# Patient Record
Sex: Male | Born: 1980 | Race: White | Hispanic: No | Marital: Single | Smoking: Never smoker
Health system: Southern US, Community
[De-identification: ages and names within clinical notes are randomized; demographics above are authoritative.]

## PROBLEM LIST (undated history)

## (undated) DIAGNOSIS — G7111 Myotonic muscular dystrophy: Secondary | ICD-10-CM

## (undated) DIAGNOSIS — J9621 Acute and chronic respiratory failure with hypoxia: Secondary | ICD-10-CM

## (undated) DIAGNOSIS — I469 Cardiac arrest, cause unspecified: Secondary | ICD-10-CM

## (undated) DIAGNOSIS — R001 Bradycardia, unspecified: Secondary | ICD-10-CM

## (undated) HISTORY — PX: TRACHEOSTOMY: SUR1362

---

## 2017-11-10 ENCOUNTER — Inpatient Hospital Stay
Admission: RE | Admit: 2017-11-10 | Discharge: 2017-12-07 | Disposition: A | Discharge status: Home Health | Payer: Medicaid Other | Source: Ambulatory Visit | Attending: Internal Medicine | Admitting: Internal Medicine

## 2017-11-10 ENCOUNTER — Other Ambulatory Visit (HOSPITAL_COMMUNITY): Payer: Medicaid Other

## 2017-11-10 DIAGNOSIS — J9621 Acute and chronic respiratory failure with hypoxia: Secondary | ICD-10-CM

## 2017-11-10 DIAGNOSIS — I469 Cardiac arrest, cause unspecified: Secondary | ICD-10-CM | POA: Diagnosis present

## 2017-11-10 DIAGNOSIS — Z431 Encounter for attention to gastrostomy: Secondary | ICD-10-CM

## 2017-11-10 DIAGNOSIS — G7111 Myotonic muscular dystrophy: Secondary | ICD-10-CM | POA: Diagnosis present

## 2017-11-10 DIAGNOSIS — R001 Bradycardia, unspecified: Secondary | ICD-10-CM | POA: Diagnosis present

## 2017-11-10 DIAGNOSIS — J189 Pneumonia, unspecified organism: Secondary | ICD-10-CM

## 2017-11-10 HISTORY — DX: Bradycardia, unspecified: R00.1

## 2017-11-10 HISTORY — DX: Myotonic muscular dystrophy: G71.11

## 2017-11-10 HISTORY — DX: Cardiac arrest, cause unspecified: I46.9

## 2017-11-10 HISTORY — DX: Acute and chronic respiratory failure with hypoxia: J96.21

## 2017-11-10 LAB — BLOOD GAS, ARTERIAL
Acid-Base Excess: 2.8 mmol/L — ABNORMAL HIGH (ref 0.0–2.0)
BICARBONATE: 28.1 mmol/L — AB (ref 20.0–28.0)
FIO2: 50
LHR: 12 {breaths}/min
MECHVT: 500 mL
O2 SAT: 99.1 %
PATIENT TEMPERATURE: 98.6
PCO2 ART: 53.2 mmHg — AB (ref 32.0–48.0)
PEEP/CPAP: 5 cmH2O
PH ART: 7.342 — AB (ref 7.350–7.450)
pO2, Arterial: 133 mmHg — ABNORMAL HIGH (ref 83.0–108.0)

## 2017-11-10 MED ORDER — IOPAMIDOL (ISOVUE-300) INJECTION 61%
INTRAVENOUS | Status: AC
  Filled 2017-11-10: qty 50

## 2017-11-11 LAB — CBC WITH DIFFERENTIAL/PLATELET
ABS IMMATURE GRANULOCYTES: 0.1 10*3/uL (ref 0.0–0.1)
BASOS PCT: 1 %
Basophils Absolute: 0.1 10*3/uL (ref 0.0–0.1)
Eosinophils Absolute: 0.3 10*3/uL (ref 0.0–0.7)
Eosinophils Relative: 3 %
HCT: 27.8 % — ABNORMAL LOW (ref 39.0–52.0)
HEMOGLOBIN: 8.2 g/dL — AB (ref 13.0–17.0)
Immature Granulocytes: 1 %
LYMPHS PCT: 11 %
Lymphs Abs: 1.1 10*3/uL (ref 0.7–4.0)
MCH: 28.5 pg (ref 26.0–34.0)
MCHC: 29.5 g/dL — ABNORMAL LOW (ref 30.0–36.0)
MCV: 96.5 fL (ref 78.0–100.0)
MONO ABS: 0.6 10*3/uL (ref 0.1–1.0)
Monocytes Relative: 6 %
Neutro Abs: 7.6 10*3/uL (ref 1.7–7.7)
Neutrophils Relative %: 78 %
PLATELETS: 161 10*3/uL (ref 150–400)
RBC: 2.88 MIL/uL — ABNORMAL LOW (ref 4.22–5.81)
RDW: 14.4 % (ref 11.5–15.5)
WBC: 9.7 10*3/uL (ref 4.0–10.5)

## 2017-11-11 LAB — COMPREHENSIVE METABOLIC PANEL
ALBUMIN: 2.5 g/dL — AB (ref 3.5–5.0)
ALK PHOS: 80 U/L (ref 38–126)
ALT: 23 U/L (ref 0–44)
AST: 27 U/L (ref 15–41)
Anion gap: 5 (ref 5–15)
BILIRUBIN TOTAL: 0.7 mg/dL (ref 0.3–1.2)
BUN: 8 mg/dL (ref 6–20)
CO2: 29 mmol/L (ref 22–32)
CREATININE: 0.67 mg/dL (ref 0.61–1.24)
Calcium: 9.1 mg/dL (ref 8.9–10.3)
Chloride: 109 mmol/L (ref 98–111)
GFR calc Af Amer: >60 mL/min (ref >60)
GFR calc non Af Amer: >60 mL/min (ref >60)
GLUCOSE: 117 mg/dL — AB (ref 70–99)
POTASSIUM: 4.3 mmol/L (ref 3.5–5.1)
Sodium: 143 mmol/L (ref 135–145)
TOTAL PROTEIN: 5.9 g/dL — AB (ref 6.5–8.1)

## 2017-11-12 DIAGNOSIS — I469 Cardiac arrest, cause unspecified: Secondary | ICD-10-CM | POA: Diagnosis not present

## 2017-11-12 DIAGNOSIS — J150 Pneumonia due to Klebsiella pneumoniae: Secondary | ICD-10-CM | POA: Diagnosis not present

## 2017-11-12 DIAGNOSIS — G7111 Myotonic muscular dystrophy: Secondary | ICD-10-CM | POA: Diagnosis not present

## 2017-11-12 DIAGNOSIS — J9621 Acute and chronic respiratory failure with hypoxia: Secondary | ICD-10-CM | POA: Diagnosis not present

## 2017-11-12 NOTE — Consult Note (Signed)
Pulmonary Critical Care Medicine [facility] [place]  PULMONARY SERVICE  Date of Service: 11/12/2017  PULMONARY CRITICAL CARE CONSULT   Seth Melton  ZOX:[id number]  DOB: 09/30/1980   DOA: 11/10/2017  Referring Physician: Carron Curie, MD  HPI: Seth Melton is a 37 y.o. male seen for follow up of Acute on Chronic Respiratory Failure.  Patient is transferred to our facility for further management and weaning.  Basically has a past medical history significant for myotonic dystrophy renal calculi.  Patient is on home oxygen for chronic respiratory failure.  Patient was admitted at the transferring facility for a right lower lobe pneumonia along with the pleural effusion.  At that time patient was emergently intubated because of respiratory failure.  He developed hypotension which required pressors.  Concern was raised that patient might have aspiration pneumonia was treated as such.  Transferred to a tertiary care center patient apparently grew Klebsiella.  He had been started on vancomycin and Zosyn and then changed over to Augmentin to complete his course.  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  Also underwent an ejection fraction which showed a normal systolic function.  Patient was eventually transferred to our facility for further management and weaning.  Review of Systems:  ROS performed and is unremarkable other than noted above.  Past Medical History:  Diagnosis Date  . Acute on chronic respiratory failure with hypoxia (HCC) 11/13/2017  . Bradycardia   . Cardiac arrest (HCC)   . Myotonic dystrophy [facility])     Past Surgical History:  Procedure Laterality Date  . TRACHEOSTOMY      Social History:    reports that he has never smoked. He has never used smokeless tobacco. He reports that he drank alcohol. He reports that he has current or past drug history.  Family History: Non-Contributory to the present  illness   Medications: Reviewed on Rounds  Physical Exam:  Vitals: Temperature is 98.9 pulse 80 respiratory rate 14 blood pressure 106/60 saturations 99%  Ventilator Settings mode of ventilation pressure support FiO2 30% tidal volume 450 pressure support 12 PEEP 5  . General: Comfortable at this time . Eyes: Grossly normal lids, irises & conjunctiva . ENT: grossly tongue is normal . Neck: no obvious mass . Cardiovascular: S1-S2 normal no gallop or rub . Respiratory: Coarse breath sounds no rhonchi rales . Abdomen: Soft and nontender . Skin: no rash seen on limited exam . Musculoskeletal: not rigid . Psychiatric:unable to assess . Neurologic: no seizure no involuntary movements         Labs on Admission:  Basic Metabolic Panel: Recent Labs  Lab 11/11/17 0939  NA 143  K 4.3  CL 109  CO2 29  GLUCOSE 117*  BUN 8  CREATININE 0.67  CALCIUM 9.1    Recent Labs  Lab 11/10/17 1830  PHART 7.342*  PCO2ART 53.2*  PO2ART 133*  HCO3 28.1*  O2SAT 99.1    Liver Function Tests: Recent Labs  Lab 11/11/17 0939  AST 27  ALT 23  ALKPHOS 80  BILITOT 0.7  PROT 5.9*  ALBUMIN 2.5*   No results for input(s): LIPASE, AMYLASE in the last 168 hours. No results for input(s): AMMONIA in the last 168 hours.  CBC: Recent Labs  Lab 11/11/17 0939  WBC 9.7  NEUTROABS 7.6  HGB 8.2*  HCT 27.8*  MCV 96.5  PLT 161    Cardiac Enzymes: No results for input(s): CKTOTAL, CKMB, CKMBINDEX, TROPONINI in the last  168 hours.  BNP (last 3 results) No results for input(s): BNP in the last 8760 hours.  ProBNP (last 3 results) No results for input(s): PROBNP in the last 8760 hours.   Radiological Exams on Admission: Dg Chest Port 1 View  Result Date: 11/10/2017 CLINICAL DATA:  37 year old male with possible pneumonia EXAM: PORTABLE CHEST 1 VIEW COMPARISON:  Concurrently obtained radiographs of the abdomen FINDINGS: Tracheostomy tube in place, midline and at the level of the  clavicles. Left subclavian approach cardiac rhythm maintenance device with leads projecting over the right atrium and right ventricle. Inspiratory volumes are very low. Nonspecific medial right basilar opacity favored to reflect atelectasis. IMPRESSION: Very low inspiratory volumes. Nonspecific right basilar opacity favored to reflect chronic atelectasis/scarring. Superimposed infiltrate would be difficult to exclude entirely. Well-positioned tracheostomy tube and left subclavian approach cardiac rhythm maintenance device. Electronically Signed   By: Malachy Moan M.D.   On: 11/10/2017 18:16   Dg Abd Portable 1v  Result Date: 11/10/2017 CLINICAL DATA:  Evaluate percutaneous gastrostomy tube position EXAM: PORTABLE ABDOMEN - 1 VIEW COMPARISON:  Concurrently obtained chest x-ray FINDINGS: Contrast injection through the existing tube opacifies the stomach and proximal small bowel. The bowel gas pattern is not obstructed. IMPRESSION: The percutaneous gastrostomy tube appears to be in the region of the gastric antrum. Electronically Signed   By: Malachy Moan M.D.   On: 11/10/2017 18:17    Assessment/Plan Active Problems:   Acute on chronic respiratory failure with hypoxia (HCC)   Myotonic dystrophy (HCC)   Cardiac arrest (HCC)   Bradycardia   1. Acute on chronic respiratory failure with hypoxia patient's weaning on pressure support tolerating it well.  The goal is for 16-hour wean continue to advance the wean as tolerated continue pulmonary toilet secretion management.  Titrate oxygen as tolerated. 2. Lobar pneumonia patient was treated with antibiotics we will continue to monitor with follow-up chest x-rays. 3. Pulmonary embolism treated he had been on Eliquis 4. Myotonic dystrophy we will continue with supportive care physical therapy to assess 5. Cardiac arrest rhythm has been stable 6. Bradycardia we will monitor the rhythm as noted  I have personally seen and evaluated the patient,  evaluated laboratory and imaging results, formulated the assessment and plan and placed orders. The Patient requires high complexity decision making for assessment and support.  Case was discussed on Rounds with the Respiratory Therapy Staff Time Spent review of the medical record and discussion with the patient's mother and father at the bedside along with discussion with medical staff  Yevonne Pax, MD [facility] Pulmonary Critical Care Medicine Sleep Medicine

## 2017-11-13 ENCOUNTER — Encounter: Payer: Self-pay | Admitting: Internal Medicine

## 2017-11-13 DIAGNOSIS — J9621 Acute and chronic respiratory failure with hypoxia: Secondary | ICD-10-CM | POA: Diagnosis not present

## 2017-11-13 DIAGNOSIS — R001 Bradycardia, unspecified: Secondary | ICD-10-CM | POA: Diagnosis not present

## 2017-11-13 DIAGNOSIS — I469 Cardiac arrest, cause unspecified: Secondary | ICD-10-CM | POA: Diagnosis not present

## 2017-11-13 DIAGNOSIS — G7111 Myotonic muscular dystrophy: Secondary | ICD-10-CM | POA: Diagnosis not present

## 2017-11-13 HISTORY — DX: Acute and chronic respiratory failure with hypoxia: J96.21

## 2017-11-13 NOTE — Progress Notes (Signed)
Pulmonary Critical Care Medicine [facility] [place]   PULMONARY CRITICAL CARE SERVICE  PROGRESS NOTE  Date of Service: 11/13/2017  Seth Melton  BUL:[id number]  DOB: [birth year]   DOA: 11/10/2017  Referring Physician: Carron Curie, MD  HPI: Seth Melton is a 37 y.o. male seen for follow up of Acute on Chronic Respiratory Failure.  Patient is on T collar has been on 35% FiO2 so far is tolerating well  Medications: Reviewed on Rounds  Physical Exam:  Vitals: Temperature 98.2 pulse 83 respiratory rate 20 blood pressure 120/69 saturations 96%  Ventilator Settings off the ventilator on T collar trials  . General: Comfortable at this time . Eyes: Grossly normal lids, irises & conjunctiva . ENT: grossly tongue is normal . Neck: no obvious mass . Cardiovascular: S1 S2 normal no gallop . Respiratory: No rhonchi no rales . Abdomen: soft . Skin: no rash seen on limited exam . Musculoskeletal: not rigid . Psychiatric:unable to assess . Neurologic: no seizure no involuntary movements         Lab Data:   Basic Metabolic Panel: Recent Labs  Lab 11/11/17 0939  NA 143  K 4.3  CL 109  CO2 29  GLUCOSE 117*  BUN 8  CREATININE 0.67  CALCIUM 9.1    ABG: Recent Labs  Lab 11/10/17 1830  PHART 7.342*  PCO2ART 53.2*  PO2ART 133*  HCO3 28.1*  O2SAT 99.1    Liver Function Tests: Recent Labs  Lab 11/11/17 0939  AST 27  ALT 23  ALKPHOS 80  BILITOT 0.7  PROT 5.9*  ALBUMIN 2.5*   No results for input(s): LIPASE, AMYLASE in the last 168 hours. No results for input(s): AMMONIA in the last 168 hours.  CBC: Recent Labs  Lab 11/11/17 0939  WBC 9.7  NEUTROABS 7.6  HGB 8.2*  HCT 27.8*  MCV 96.5  PLT 161    Cardiac Enzymes: No results for input(s): CKTOTAL, CKMB, CKMBINDEX, TROPONINI in the last 168 hours.  BNP (last 3 results) No results for input(s): BNP in the last 8760 hours.  ProBNP (last 3 results) No results for input(s): PROBNP in the last  8760 hours.  Radiological Exams: No results found.  Assessment/Plan Active Problems:   Acute on chronic respiratory failure with hypoxia (HCC)   Myotonic dystrophy (HCC)   Cardiac arrest (HCC)   Bradycardia   1. Acute on chronic respiratory failure with hypoxia continue to wean on T collar as ordered.  Continue with secretion management pulmonary toilet. 2. Myotonic dystrophy at baseline continue with supportive care. 3. Cardiac arrest rhythm has been stable 4. Bradycardia stable continue to monitor   I have personally seen and evaluated the patient, evaluated laboratory and imaging results, formulated the assessment and plan and placed orders. The Patient requires high complexity decision making for assessment and support.  Case was discussed on Rounds with the Respiratory Therapy Staff  Yevonne Pax, MD [facility] Pulmonary Critical Care Medicine Sleep Medicine

## 2017-11-14 DIAGNOSIS — R001 Bradycardia, unspecified: Secondary | ICD-10-CM | POA: Diagnosis not present

## 2017-11-14 DIAGNOSIS — I469 Cardiac arrest, cause unspecified: Secondary | ICD-10-CM | POA: Diagnosis not present

## 2017-11-14 DIAGNOSIS — J9621 Acute and chronic respiratory failure with hypoxia: Secondary | ICD-10-CM | POA: Diagnosis not present

## 2017-11-14 DIAGNOSIS — G7111 Myotonic muscular dystrophy: Secondary | ICD-10-CM | POA: Diagnosis not present

## 2017-11-14 NOTE — Progress Notes (Signed)
Pulmonary Critical Care Medicine [facility] [place]   PULMONARY CRITICAL CARE SERVICE  PROGRESS NOTE  Date of Service: 11/14/2017  KARAR WILKER  DJM:[id number]  DOB: Dec 31, 1980   DOA: 11/10/2017  Referring Physician: Carron Curie, MD  HPI: BATUHAN ROLLER is a 37 y.o. male seen for follow up of Acute on Chronic Respiratory Failure.  Currently is on T collar has been on 35% oxygen did about 11 hours yesterday today the goal is to do about 16 hours on the T collar  Medications: Reviewed on Rounds  Physical Exam:  Vitals: Temperature 98.2 pulse 80 respiratory rate 12 blood pressure 90/56 saturation 96%  Ventilator Settings off the ventilator on T collar FiO2 35%  . General: Comfortable at this time . Eyes: Grossly normal lids, irises & conjunctiva . ENT: grossly tongue is normal . Neck: no obvious mass . Cardiovascular: S1 S2 normal no gallop . Respiratory: Good air entry no rhonchi or rales . Abdomen: soft . Skin: no rash seen on limited exam . Musculoskeletal: not rigid . Psychiatric:unable to assess . Neurologic: no seizure no involuntary movements         Lab Data:   Basic Metabolic Panel: Recent Labs  Lab 11/11/17 0939  NA 143  K 4.3  CL 109  CO2 29  GLUCOSE 117*  BUN 8  CREATININE 0.67  CALCIUM 9.1    ABG: Recent Labs  Lab 11/10/17 1830  PHART 7.342*  PCO2ART 53.2*  PO2ART 133*  HCO3 28.1*  O2SAT 99.1    Liver Function Tests: Recent Labs  Lab 11/11/17 0939  AST 27  ALT 23  ALKPHOS 80  BILITOT 0.7  PROT 5.9*  ALBUMIN 2.5*   No results for input(s): LIPASE, AMYLASE in the last 168 hours. No results for input(s): AMMONIA in the last 168 hours.  CBC: Recent Labs  Lab 11/11/17 0939  WBC 9.7  NEUTROABS 7.6  HGB 8.2*  HCT 27.8*  MCV 96.5  PLT 161    Cardiac Enzymes: No results for input(s): CKTOTAL, CKMB, CKMBINDEX, TROPONINI in the last 168 hours.  BNP (last 3 results) No results for input(s): BNP in the last 8760  hours.  ProBNP (last 3 results) No results for input(s): PROBNP in the last 8760 hours.  Radiological Exams: No results found.  Assessment/Plan Active Problems:   Acute on chronic respiratory failure with hypoxia (HCC)   Myotonic dystrophy (HCC)   Cardiac arrest (HCC)   Bradycardia   1. Acute on chronic respiratory failure with hypoxia doing well on the wean right now we will continue to advance the goal is for about 16 hours. 2. Myotonic dystrophy at baseline 3. Cardiac arrest rhythm is stable 4. Bradycardia as noted rhythm is stable right now we will continue to monitor   I have personally seen and evaluated the patient, evaluated laboratory and imaging results, formulated the assessment and plan and placed orders. The Patient requires high complexity decision making for assessment and support.  Case was discussed on Rounds with the Respiratory Therapy Staff  Yevonne Pax, MD [facility] Pulmonary Critical Care Medicine Sleep Medicine

## 2017-11-15 DIAGNOSIS — G7111 Myotonic muscular dystrophy: Secondary | ICD-10-CM | POA: Diagnosis not present

## 2017-11-15 DIAGNOSIS — I469 Cardiac arrest, cause unspecified: Secondary | ICD-10-CM | POA: Diagnosis not present

## 2017-11-15 DIAGNOSIS — J9621 Acute and chronic respiratory failure with hypoxia: Secondary | ICD-10-CM | POA: Diagnosis not present

## 2017-11-15 DIAGNOSIS — R001 Bradycardia, unspecified: Secondary | ICD-10-CM | POA: Diagnosis not present

## 2017-11-15 NOTE — Progress Notes (Signed)
Pulmonary Critical Care Medicine [facility] [place]   PULMONARY CRITICAL CARE SERVICE  PROGRESS NOTE  Date of Service: 11/15/2017  Seth Melton  [id number]:[id number]  DOB: [birth year]   DOA: 11/10/2017  Referring Physician: Carron Curie, MD  HPI: Seth Melton is a 37 y.o. male seen for follow up of Acute on Chronic Respiratory Failure.  Patient is weaning doing fairly well has been on T collar the goal today is for 20 hours  Medications: Reviewed on Rounds  Physical Exam:  Vitals: Temperature 98.0 pulse 80 respiratory rate 12 blood pressure 102/62 saturation 95%  Ventilator Settings off the ventilator on T collar FiO2 35%  . General: Comfortable at this time . Eyes: Grossly normal lids, irises & conjunctiva . ENT: grossly tongue is normal . Neck: no obvious mass . Cardiovascular: S1 S2 normal no gallop . Respiratory: Coarse breath sounds few rhonchi . Abdomen: soft . Skin: no rash seen on limited exam . Musculoskeletal: not rigid . Psychiatric:unable to assess . Neurologic: no seizure no involuntary movements         Lab Data:   Basic Metabolic Panel: Recent Labs  Lab 11/11/17 0939  NA 143  K 4.3  CL 109  CO2 29  GLUCOSE 117*  BUN 8  CREATININE 0.67  CALCIUM 9.1    ABG: Recent Labs  Lab 11/10/17 1830  PHART 7.342*  PCO2ART 53.2*  PO2ART 133*  HCO3 28.1*  O2SAT 99.1    Liver Function Tests: Recent Labs  Lab 11/11/17 0939  AST 27  ALT 23  ALKPHOS 80  BILITOT 0.7  PROT 5.9*  ALBUMIN 2.5*   No results for input(s): LIPASE, AMYLASE in the last 168 hours. No results for input(s): AMMONIA in the last 168 hours.  CBC: Recent Labs  Lab 11/11/17 0939  WBC 9.7  NEUTROABS 7.6  HGB 8.2*  HCT 27.8*  MCV 96.5  PLT 161    Cardiac Enzymes: No results for input(s): CKTOTAL, CKMB, CKMBINDEX, TROPONINI in the last 168 hours.  BNP (last 3 results) No results for input(s): BNP in the last 8760 hours.  ProBNP (last 3 results) No results  for input(s): PROBNP in the last 8760 hours.  Radiological Exams: No results found.  Assessment/Plan Active Problems:   Acute on chronic respiratory failure with hypoxia (HCC)   Myotonic dystrophy (HCC)   Cardiac arrest (HCC)   Bradycardia   1. Acute on chronic respiratory failure with hypoxia we will continue with weaning on T collar continue aggressive pulmonary toilet supportive care.  The goal is 20 hours 2. Myotonic dystrophy at baseline continue to monitor 3. Cardiac arrest stable rhythm 4. Bradycardia rhythm has been stable   I have personally seen and evaluated the patient, evaluated laboratory and imaging results, formulated the assessment and plan and placed orders. The Patient requires high complexity decision making for assessment and support.  Case was discussed on Rounds with the Respiratory Therapy Staff  Yevonne Pax, MD [facility] Pulmonary Critical Care Medicine Sleep Medicine

## 2017-11-16 DIAGNOSIS — I469 Cardiac arrest, cause unspecified: Secondary | ICD-10-CM | POA: Diagnosis not present

## 2017-11-16 DIAGNOSIS — J9621 Acute and chronic respiratory failure with hypoxia: Secondary | ICD-10-CM | POA: Diagnosis not present

## 2017-11-16 DIAGNOSIS — G7111 Myotonic muscular dystrophy: Secondary | ICD-10-CM | POA: Diagnosis not present

## 2017-11-16 DIAGNOSIS — R001 Bradycardia, unspecified: Secondary | ICD-10-CM | POA: Diagnosis not present

## 2017-11-16 NOTE — Progress Notes (Signed)
Pulmonary Critical Care Medicine [facility] [place]   PULMONARY CRITICAL CARE SERVICE  PROGRESS NOTE  Date of Service: 11/16/2017  Seth Melton  CMK:[id number]  DOB: [birth year]   DOA: 11/10/2017  Referring Physician: Carron Curie, MD  HPI: Seth Melton is a 37 y.o. male seen for follow up of Acute on Chronic Respiratory Failure.  Patient is weaning doing well on the T collar the goal is for 24 hours.  Right now is on 35% oxygen good saturations are noted.  Medications: Reviewed on Rounds  Physical Exam:  Vitals: Temperature 99.2 pulse 84 respiratory rate 13 blood pressure 96/55 saturations 93%  Ventilator Settings off of the ventilator right now on T collar  . General: Comfortable at this time . Eyes: Grossly normal lids, irises & conjunctiva . ENT: grossly tongue is normal . Neck: no obvious mass . Cardiovascular: S1 S2 normal no gallop . Respiratory: No rhonchi no rales . Abdomen: soft . Skin: no rash seen on limited exam . Musculoskeletal: not rigid . Psychiatric:unable to assess . Neurologic: no seizure no involuntary movements         Lab Data:   Basic Metabolic Panel: Recent Labs  Lab 11/11/17 0939  NA 143  K 4.3  CL 109  CO2 29  GLUCOSE 117*  BUN 8  CREATININE 0.67  CALCIUM 9.1    ABG: Recent Labs  Lab 11/10/17 1830  PHART 7.342*  PCO2ART 53.2*  PO2ART 133*  HCO3 28.1*  O2SAT 99.1    Liver Function Tests: Recent Labs  Lab 11/11/17 0939  AST 27  ALT 23  ALKPHOS 80  BILITOT 0.7  PROT 5.9*  ALBUMIN 2.5*   No results for input(s): LIPASE, AMYLASE in the last 168 hours. No results for input(s): AMMONIA in the last 168 hours.  CBC: Recent Labs  Lab 11/11/17 0939  WBC 9.7  NEUTROABS 7.6  HGB 8.2*  HCT 27.8*  MCV 96.5  PLT 161    Cardiac Enzymes: No results for input(s): CKTOTAL, CKMB, CKMBINDEX, TROPONINI in the last 168 hours.  BNP (last 3 results) No results for input(s): BNP in the last 8760  hours.  ProBNP (last 3 results) No results for input(s): PROBNP in the last 8760 hours.  Radiological Exams: No results found.  Assessment/Plan Active Problems:   Acute on chronic respiratory failure with hypoxia (HCC)   Myotonic dystrophy (HCC)   Cardiac arrest (HCC)   Bradycardia   1. Acute on chronic respiratory failure with hypoxia continue to wean on T collar as tolerated as mentioned above the goal is for 24 hours. 2. Myotonic dystrophy at baseline we will continue to monitor 3. Cardiac arrest rhythm has been stable 4. Bradycardia we will continue present management   I have personally seen and evaluated the patient, evaluated laboratory and imaging results, formulated the assessment and plan and placed orders. The Patient requires high complexity decision making for assessment and support.  Case was discussed on Rounds with the Respiratory Therapy Staff  Yevonne Pax, MD [facility] Pulmonary Critical Care Medicine Sleep Medicine

## 2017-11-17 DIAGNOSIS — G7111 Myotonic muscular dystrophy: Secondary | ICD-10-CM | POA: Diagnosis not present

## 2017-11-17 DIAGNOSIS — I469 Cardiac arrest, cause unspecified: Secondary | ICD-10-CM | POA: Diagnosis not present

## 2017-11-17 DIAGNOSIS — J9621 Acute and chronic respiratory failure with hypoxia: Secondary | ICD-10-CM | POA: Diagnosis not present

## 2017-11-17 DIAGNOSIS — R001 Bradycardia, unspecified: Secondary | ICD-10-CM | POA: Diagnosis not present

## 2017-11-17 LAB — BASIC METABOLIC PANEL
Anion gap: 8 (ref 5–15)
BUN: 10 mg/dL (ref 6–20)
CALCIUM: 9.7 mg/dL (ref 8.9–10.3)
CO2: 33 mmol/L — AB (ref 22–32)
CREATININE: 0.73 mg/dL (ref 0.61–1.24)
Chloride: 105 mmol/L (ref 98–111)
GFR calc non Af Amer: >60 mL/min (ref >60)
Glucose, Bld: 103 mg/dL — ABNORMAL HIGH (ref 70–99)
Potassium: 4.1 mmol/L (ref 3.5–5.1)
Sodium: 146 mmol/L — ABNORMAL HIGH (ref 135–145)

## 2017-11-17 LAB — CBC
HCT: 32.7 % — ABNORMAL LOW (ref 39.0–52.0)
Hemoglobin: 9.9 g/dL — ABNORMAL LOW (ref 13.0–17.0)
MCH: 29 pg (ref 26.0–34.0)
MCHC: 30.3 g/dL (ref 30.0–36.0)
MCV: 95.9 fL (ref 78.0–100.0)
Platelets: 218 10*3/uL (ref 150–400)
RBC: 3.41 MIL/uL — ABNORMAL LOW (ref 4.22–5.81)
RDW: 14.4 % (ref 11.5–15.5)
WBC: 7.2 10*3/uL (ref 4.0–10.5)

## 2017-11-17 NOTE — Progress Notes (Signed)
[facility] [place]   PULMONARY CRITICAL CARE SERVICE  PROGRESS NOTE  Date of Service: 11/17/2017  Seth Melton  WGN:[id number]  DOB: Jun 06, 1980   DOA: 11/10/2017  Referring Physician: Carron CurieAli Hijazi, MD  HPI: Seth Melton is a 37 y.o. male seen for follow up of Acute on Chronic Respiratory Failure.  Patient is weaning on T collar right now is on 35% FiO2 for more than 24 hours  Medications: Reviewed on Rounds  Physical Exam:  Vitals: Temperature 98.7 pulse 86 respiratory rate 15 blood pressure 100/62 saturations 98%  Ventilator Settings off the ventilator on T collar at this time  . General: Comfortable at this time . Eyes: Grossly normal lids, irises & conjunctiva . ENT: grossly tongue is normal . Neck: no obvious mass . Cardiovascular: S1 S2 normal no gallop . Respiratory: No rhonchi or rales are noted . Abdomen: soft . Skin: no rash seen on limited exam . Musculoskeletal: not rigid . Psychiatric:unable to assess . Neurologic: no seizure no involuntary movements         Lab Data:   Basic Metabolic Panel: Recent Labs  Lab 11/11/17 0939 11/17/17 0630  NA 143 146*  K 4.3 4.1  CL 109 105  CO2 29 33*  GLUCOSE 117* 103*  BUN 8 10  CREATININE 0.67 0.73  CALCIUM 9.1 9.7    ABG: Recent Labs  Lab 11/10/17 1830  PHART 7.342*  PCO2ART 53.2*  PO2ART 133*  HCO3 28.1*  O2SAT 99.1    Liver Function Tests: Recent Labs  Lab 11/11/17 0939  AST 27  ALT 23  ALKPHOS 80  BILITOT 0.7  PROT 5.9*  ALBUMIN 2.5*   No results for input(s): LIPASE, AMYLASE in the last 168 hours. No results for input(s): AMMONIA in the last 168 hours.  CBC: Recent Labs  Lab 11/11/17 0939 11/17/17 0630  WBC 9.7 7.2  NEUTROABS 7.6  --   HGB 8.2* 9.9*  HCT 27.8* 32.7*  MCV 96.5 95.9  PLT 161 218    Cardiac Enzymes: No results for input(s): CKTOTAL, CKMB, CKMBINDEX, TROPONINI in the last 168 hours.  BNP (last 3 results) No  results for input(s): BNP in the last 8760 hours.  ProBNP (last 3 results) No results for input(s): PROBNP in the last 8760 hours.  Radiological Exams: No results found.  Assessment/Plan Active Problems:   Acute on chronic respiratory failure with hypoxia (HCC)   Myotonic dystrophy (HCC)   Cardiac arrest (HCC)   Bradycardia   1. Acute on chronic respiratory failure with hypoxia we will continue with weaning on T collar hopefully we should be able to try capping once he is past 72 hours. 2. Myotonic dystrophy at baseline continue with supportive care 3. Cardiac arrest at baseline 4. Bradycardia grossly unchanged rate is controlled right now   I have personally seen and evaluated the patient, evaluated laboratory and imaging results, formulated the assessment and plan and placed orders. The Patient requires high complexity decision making for assessment and support.  Case was discussed on Rounds with the Respiratory Therapy Staff  Yevonne PaxSaadat A Anyia Gierke, MD [facility] Pulmonary Critical Care Medicine Sleep Medicine

## 2017-11-19 LAB — CULTURE, RESPIRATORY

## 2017-11-19 LAB — CULTURE, RESPIRATORY W GRAM STAIN

## 2017-11-21 DIAGNOSIS — G7111 Myotonic muscular dystrophy: Secondary | ICD-10-CM | POA: Diagnosis not present

## 2017-11-21 DIAGNOSIS — I469 Cardiac arrest, cause unspecified: Secondary | ICD-10-CM | POA: Diagnosis not present

## 2017-11-21 DIAGNOSIS — R001 Bradycardia, unspecified: Secondary | ICD-10-CM | POA: Diagnosis not present

## 2017-11-21 DIAGNOSIS — J9621 Acute and chronic respiratory failure with hypoxia: Secondary | ICD-10-CM | POA: Diagnosis not present

## 2017-11-21 LAB — VANCOMYCIN, TROUGH: VANCOMYCIN TR: 33 ug/mL — AB (ref 15–20)

## 2017-11-21 NOTE — Progress Notes (Signed)
[facility] [place]   PULMONARY CRITICAL CARE SERVICE  PROGRESS NOTE  Date of Service: 11/21/2017  Seth Nakayamaaron L Melton  ZOX:[id number]:[id number]  DOB: [birth year]   DOA: 11/10/2017  Referring Physician: Carron CurieAli Hijazi, MD  HPI: Seth Melton is a 37 y.o. male seen for follow up of Acute on Chronic Respiratory Failure.  Patient is doing well has been on T collar right now is on 28% oxygen also tolerating the PMV fairly well.  Medications: Reviewed on Rounds  Physical Exam:  Vitals: Temperature 98.3 pulse 101 respiratory rate 22 blood pressure 110/71 saturations 93%  Ventilator Settings off the ventilator on T collar trials at this time  . General: Comfortable at this time . Eyes: Grossly normal lids, irises & conjunctiva . ENT: grossly tongue is normal . Neck: no obvious mass . Cardiovascular: S1 S2 normal no gallop . Respiratory: No rhonchi no rales are noted . Abdomen: soft . Skin: no rash seen on limited exam . Musculoskeletal: not rigid . Psychiatric:unable to assess . Neurologic: no seizure no involuntary movements         Lab Data:   Basic Metabolic Panel: Recent Labs  Lab 11/17/17 0630  NA 146*  K 4.1  CL 105  CO2 33*  GLUCOSE 103*  BUN 10  CREATININE 0.73  CALCIUM 9.7    ABG: No results for input(s): PHART, PCO2ART, PO2ART, HCO3, O2SAT in the last 168 hours.  Liver Function Tests: No results for input(s): AST, ALT, ALKPHOS, BILITOT, PROT, ALBUMIN in the last 168 hours. No results for input(s): LIPASE, AMYLASE in the last 168 hours. No results for input(s): AMMONIA in the last 168 hours.  CBC: Recent Labs  Lab 11/17/17 0630  WBC 7.2  HGB 9.9*  HCT 32.7*  MCV 95.9  PLT 218    Cardiac Enzymes: No results for input(s): CKTOTAL, CKMB, CKMBINDEX, TROPONINI in the last 168 hours.  BNP (last 3 results) No results for input(s): BNP in the last 8760 hours.  ProBNP (last 3 results) No results for input(s): PROBNP in  the last 8760 hours.  Radiological Exams: No results found.  Assessment/Plan Active Problems:   Acute on chronic respiratory failure with hypoxia (HCC)   Myotonic dystrophy (HCC)   Cardiac arrest (HCC)   Bradycardia   1. Acute on chronic respiratory failure with hypoxia continue with the weaning on T collar he is done well now for several days.  Has a #6 Shiley in place 2. Myotonic dystrophy at baseline we will continue present management. 3. Status post cardiac arrest rhythm is been stable 4. Bradycardia at baseline we will monitor   I have personally seen and evaluated the patient, evaluated laboratory and imaging results, formulated the assessment and plan and placed orders. The Patient requires high complexity decision making for assessment and support.  Case was discussed on Rounds with the Respiratory Therapy Staff  Yevonne PaxSaadat A Ivah Girardot, MD [facility] Pulmonary Critical Care Medicine Sleep Medicine

## 2017-11-22 DIAGNOSIS — R001 Bradycardia, unspecified: Secondary | ICD-10-CM | POA: Diagnosis not present

## 2017-11-22 DIAGNOSIS — J9621 Acute and chronic respiratory failure with hypoxia: Secondary | ICD-10-CM | POA: Diagnosis not present

## 2017-11-22 DIAGNOSIS — I469 Cardiac arrest, cause unspecified: Secondary | ICD-10-CM | POA: Diagnosis not present

## 2017-11-22 DIAGNOSIS — G7111 Myotonic muscular dystrophy: Secondary | ICD-10-CM | POA: Diagnosis not present

## 2017-11-22 NOTE — Progress Notes (Signed)
[facility]   PULMONARY CRITICAL CARE SERVICE  PROGRESS NOTE  Date of Service: 11/22/2017  Seth Melton  ZOX:[id number]  DOB: [birth year]   DOA: 11/10/2017  Referring Physician: Carron CurieAli Hijazi, MD  HPI: Seth Melton is a 37 y.o. male seen for follow up of Acute on Chronic Respiratory Failure.  He is doing well with the weaning.  He is on 35% T collar with the PMV  Medications: Reviewed on Rounds  Physical Exam:  Vitals: Temperature 97.6 pulse 79 respiratory rate 21 blood pressure 103/61 saturations 95%  Ventilator Settings off the ventilator on T collar currently 35% FiO2  . General: Comfortable at this time . Eyes: Grossly normal lids, irises & conjunctiva . ENT: grossly tongue is normal . Neck: no obvious mass . Cardiovascular: S1 S2 normal no gallop . Respiratory: No rhonchi no rales . Abdomen: soft . Skin: no rash seen on limited exam . Musculoskeletal: not rigid . Psychiatric:unable to assess . Neurologic: no seizure no involuntary movements         Lab Data:   Basic Metabolic Panel: Recent Labs  Lab 11/17/17 0630  NA 146*  K 4.1  CL 105  CO2 33*  GLUCOSE 103*  BUN 10  CREATININE 0.73  CALCIUM 9.7    ABG: No results for input(s): PHART, PCO2ART, PO2ART, HCO3, O2SAT in the last 168 hours.  Liver Function Tests: No results for input(s): AST, ALT, ALKPHOS, BILITOT, PROT, ALBUMIN in the last 168 hours. No results for input(s): LIPASE, AMYLASE in the last 168 hours. No results for input(s): AMMONIA in the last 168 hours.  CBC: Recent Labs  Lab 11/17/17 0630  WBC 7.2  HGB 9.9*  HCT 32.7*  MCV 95.9  PLT 218    Cardiac Enzymes: No results for input(s): CKTOTAL, CKMB, CKMBINDEX, TROPONINI in the last 168 hours.  BNP (last 3 results) No results for input(s): BNP in the last 8760 hours.  ProBNP (last 3 results) No results for input(s): PROBNP in the last 8760 hours.  Radiological Exams: No  results found.  Assessment/Plan Active Problems:   Acute on chronic respiratory failure with hypoxia (HCC)   Myotonic dystrophy (HCC)   Cardiac arrest (HCC)   Bradycardia   1. Acute on chronic respiratory failure with hypoxia patient will have the trach changed over to a #6 cuffless trach and then I would like to try to see about beginning capping trials. 2. Myotonic dystrophy continue with supportive care 3. Cardiac arrest at baseline we will continue present management 4. Bradycardia rate is controlled   I have personally seen and evaluated the patient, evaluated laboratory and imaging results, formulated the assessment and plan and placed orders. The Patient requires high complexity decision making for assessment and support.  Case was discussed on Rounds with the Respiratory Therapy Staff  Yevonne PaxSaadat A Brantly Kalman, MD [facility] Pulmonary Critical Care Medicine Sleep Medicine

## 2017-11-23 DIAGNOSIS — I469 Cardiac arrest, cause unspecified: Secondary | ICD-10-CM | POA: Diagnosis not present

## 2017-11-23 DIAGNOSIS — R001 Bradycardia, unspecified: Secondary | ICD-10-CM | POA: Diagnosis not present

## 2017-11-23 DIAGNOSIS — J9621 Acute and chronic respiratory failure with hypoxia: Secondary | ICD-10-CM | POA: Diagnosis not present

## 2017-11-23 DIAGNOSIS — G7111 Myotonic muscular dystrophy: Secondary | ICD-10-CM | POA: Diagnosis not present

## 2017-11-23 LAB — VANCOMYCIN, TROUGH: Vancomycin Tr: 4 ug/mL — ABNORMAL LOW (ref 15–20)

## 2017-11-23 NOTE — Progress Notes (Signed)
[facility]   PULMONARY CRITICAL CARE SERVICE  PROGRESS NOTE  Date of Service: 11/23/2017  Seth Nakayamaaron L Melton  AVW:[id number]  DOB: 07-08-1980   DOA: 11/10/2017  Referring Physician: Carron CurieAli Hijazi, MD  HPI: Seth Nakayamaaron L Seth Melton is a 37 y.o. male seen for follow up of Acute on Chronic Respiratory Failure.  He is capping doing well has been capped overnight no distress noted.  No desaturations are noted.  Medications: Reviewed on Rounds  Physical Exam:  Vitals: Temperature 98.4 pulse 81 respiratory rate 16 blood pressure 113/63 saturation 98%  Ventilator Settings off the ventilator capping  . General: Comfortable at this time . Eyes: Grossly normal lids, irises & conjunctiva . ENT: grossly tongue is normal . Neck: no obvious mass . Cardiovascular: S1 S2 normal no gallop . Respiratory: No rhonchi or rales are noted at this time . Abdomen: soft . Skin: no rash seen on limited exam . Musculoskeletal: not rigid . Psychiatric:unable to assess . Neurologic: no seizure no involuntary movements         Lab Data:   Basic Metabolic Panel: Recent Labs  Lab 11/17/17 0630  NA 146*  K 4.1  CL 105  CO2 33*  GLUCOSE 103*  BUN 10  CREATININE 0.73  CALCIUM 9.7    ABG: No results for input(s): PHART, PCO2ART, PO2ART, HCO3, O2SAT in the last 168 hours.  Liver Function Tests: No results for input(s): AST, ALT, ALKPHOS, BILITOT, PROT, ALBUMIN in the last 168 hours. No results for input(s): LIPASE, AMYLASE in the last 168 hours. No results for input(s): AMMONIA in the last 168 hours.  CBC: Recent Labs  Lab 11/17/17 0630  WBC 7.2  HGB 9.9*  HCT 32.7*  MCV 95.9  PLT 218    Cardiac Enzymes: No results for input(s): CKTOTAL, CKMB, CKMBINDEX, TROPONINI in the last 168 hours.  BNP (last 3 results) No results for input(s): BNP in the last 8760 hours.  ProBNP (last 3 results) No results for input(s): PROBNP in the last 8760  hours.  Radiological Exams: No results found.  Assessment/Plan Active Problems:   Acute on chronic respiratory failure with hypoxia (HCC)   Myotonic dystrophy (HCC)   Cardiac arrest (HCC)   Bradycardia   1. Acute on chronic respiratory failure with hypoxia patient will be continued to.  I spoke to the mother regarding the possibility of him having central sleep apnea with his underlying medical condition and she states that he is never had a sleep study done but she plans on getting a sleep study done when he is discharged from our facility. 2. Myotonic dystrophy continue with therapy as tolerated. 3. Cardiac arrest at baseline we will continue with supportive care 4. Bradycardia no bradycardic episodes are noted at this time.   I have personally seen and evaluated the patient, evaluated laboratory and imaging results, formulated the assessment and plan and placed orders. The Patient requires high complexity decision making for assessment and support.  Case was discussed on Rounds with the Respiratory Therapy Staff  Yevonne PaxSaadat A Nelsy Madonna, MD [facility] Pulmonary Critical Care Medicine Sleep Medicine

## 2017-11-24 ENCOUNTER — Institutional Professional Consult (permissible substitution) (HOSPITAL_COMMUNITY): Payer: Medicaid Other

## 2017-11-24 DIAGNOSIS — R001 Bradycardia, unspecified: Secondary | ICD-10-CM | POA: Diagnosis not present

## 2017-11-24 DIAGNOSIS — G7111 Myotonic muscular dystrophy: Secondary | ICD-10-CM | POA: Diagnosis not present

## 2017-11-24 DIAGNOSIS — I469 Cardiac arrest, cause unspecified: Secondary | ICD-10-CM | POA: Diagnosis not present

## 2017-11-24 DIAGNOSIS — J9621 Acute and chronic respiratory failure with hypoxia: Secondary | ICD-10-CM | POA: Diagnosis not present

## 2017-11-24 LAB — BASIC METABOLIC PANEL
ANION GAP: 9 (ref 5–15)
BUN: 16 mg/dL (ref 6–20)
CALCIUM: 9.9 mg/dL (ref 8.9–10.3)
CO2: 34 mmol/L — ABNORMAL HIGH (ref 22–32)
Chloride: 102 mmol/L (ref 98–111)
Creatinine, Ser: 0.64 mg/dL (ref 0.61–1.24)
Glucose, Bld: 110 mg/dL — ABNORMAL HIGH (ref 70–99)
Potassium: 4.3 mmol/L (ref 3.5–5.1)
Sodium: 145 mmol/L (ref 135–145)

## 2017-11-24 LAB — CBC
HCT: 38.1 % — ABNORMAL LOW (ref 39.0–52.0)
Hemoglobin: 11 g/dL — ABNORMAL LOW (ref 13.0–17.0)
MCH: 28.6 pg (ref 26.0–34.0)
MCHC: 28.9 g/dL — ABNORMAL LOW (ref 30.0–36.0)
MCV: 99.2 fL (ref 78.0–100.0)
Platelets: 180 10*3/uL (ref 150–400)
RBC: 3.84 MIL/uL — ABNORMAL LOW (ref 4.22–5.81)
RDW: 14.4 % (ref 11.5–15.5)
WBC: 9.8 10*3/uL (ref 4.0–10.5)

## 2017-11-24 NOTE — Progress Notes (Signed)
[facility]   PULMONARY CRITICAL CARE SERVICE  PROGRESS NOTE  Date of Service: 11/24/2017  Seth Melton  ZOX:[id number]  DOB: 24-Nov-1980   DOA: 11/10/2017  Referring Physician: Carron CurieAli Hijazi, MD  HPI: Seth Melton is a 37 y.o. male seen for follow up of Acute on Chronic Respiratory Failure.  He is doing well.  Has been capping overnight now 25  Medications: Reviewed on Rounds  Physical Exam:  Vitals: Temperature 97.7 pulse 83 respiratory rate 16 blood pressure 115/71 saturations 98%  Ventilator Settings off of the ventilator capping  . General: Comfortable at this time . Eyes: Grossly normal lids, irises & conjunctiva . ENT: grossly tongue is normal . Neck: no obvious mass . Cardiovascular: S1 S2 normal no gallop . Respiratory: No rhonchi no rales are noted . Abdomen: soft . Skin: no rash seen on limited exam . Musculoskeletal: not rigid . Psychiatric:unable to assess . Neurologic: no seizure no involuntary movements         Lab Data:   Basic Metabolic Panel: Recent Labs  Lab 11/24/17 0557  NA 145  K 4.3  CL 102  CO2 34*  GLUCOSE 110*  BUN 16  CREATININE 0.64  CALCIUM 9.9    ABG: No results for input(s): PHART, PCO2ART, PO2ART, HCO3, O2SAT in the last 168 hours.  Liver Function Tests: No results for input(s): AST, ALT, ALKPHOS, BILITOT, PROT, ALBUMIN in the last 168 hours. No results for input(s): LIPASE, AMYLASE in the last 168 hours. No results for input(s): AMMONIA in the last 168 hours.  CBC: Recent Labs  Lab 11/24/17 0557  WBC 9.8  HGB 11.0*  HCT 38.1*  MCV 99.2  PLT 180    Cardiac Enzymes: No results for input(s): CKTOTAL, CKMB, CKMBINDEX, TROPONINI in the last 168 hours.  BNP (last 3 results) No results for input(s): BNP in the last 8760 hours.  ProBNP (last 3 results) No results for input(s): PROBNP in the last 8760 hours.  Radiological Exams: No results  found.  Assessment/Plan Active Problems:   Acute on chronic respiratory failure with hypoxia (HCC)   Myotonic dystrophy (HCC)   Cardiac arrest (HCC)   Bradycardia   1. Acute on chronic respiratory failure with hypoxia patient will continue with capping trials as ordered.  Spoke with the failure updated.  Working towards eventual decannulation. 2. Myotonic dystrophy continue with supportive care 3. Cardiac arrest at baseline rhythm we will follow 4. Bradycardia no arrhythmias were noted   I have personally seen and evaluated the patient, evaluated laboratory and imaging results, formulated the assessment and plan and placed orders. The Patient requires high complexity decision making for assessment and support.  Case was discussed on Rounds with the Respiratory Therapy Staff  Yevonne PaxSaadat A Kenlyn Lose, MD [facility] Pulmonary Critical Care Medicine Sleep Medicine

## 2017-11-25 DIAGNOSIS — G7111 Myotonic muscular dystrophy: Secondary | ICD-10-CM | POA: Diagnosis not present

## 2017-11-25 DIAGNOSIS — I469 Cardiac arrest, cause unspecified: Secondary | ICD-10-CM | POA: Diagnosis not present

## 2017-11-25 DIAGNOSIS — J9621 Acute and chronic respiratory failure with hypoxia: Secondary | ICD-10-CM | POA: Diagnosis not present

## 2017-11-25 DIAGNOSIS — R001 Bradycardia, unspecified: Secondary | ICD-10-CM | POA: Diagnosis not present

## 2017-11-25 NOTE — Progress Notes (Signed)
[facility]   PULMONARY CRITICAL CARE SERVICE  PROGRESS NOTE  Date of Service: 11/25/2017  Seth Nakayamaaron L Melton  ZOX:[id number]  DOB: 09-06-1980   DOA: 11/10/2017  Referring Physician: Carron CurieAli Hijazi, MD  HPI: Seth Melton is a 37 y.o. male seen for follow up of Acute on Chronic Respiratory Failure.  Remains capping doing well.  Patient's been capping for more than 48 hours  Medications: Reviewed on Rounds  Physical Exam:  Vitals: Temperature 97.7 pulse 80 respiratory rate 20 blood pressure 112/56 saturations 98%  Ventilator Settings off the ventilator  . General: Comfortable at this time . Eyes: Grossly normal lids, irises & conjunctiva . ENT: grossly tongue is normal . Neck: no obvious mass . Cardiovascular: S1 S2 normal no gallop . Respiratory: No rhonchi or rales are noted . Abdomen: soft . Skin: no rash seen on limited exam . Musculoskeletal: not rigid . Psychiatric:unable to assess . Neurologic: no seizure no involuntary movements         Lab Data:   Basic Metabolic Panel: Recent Labs  Lab 11/24/17 0557  NA 145  K 4.3  CL 102  CO2 34*  GLUCOSE 110*  BUN 16  CREATININE 0.64  CALCIUM 9.9    ABG: No results for input(s): PHART, PCO2ART, PO2ART, HCO3, O2SAT in the last 168 hours.  Liver Function Tests: No results for input(s): AST, ALT, ALKPHOS, BILITOT, PROT, ALBUMIN in the last 168 hours. No results for input(s): LIPASE, AMYLASE in the last 168 hours. No results for input(s): AMMONIA in the last 168 hours.  CBC: Recent Labs  Lab 11/24/17 0557  WBC 9.8  HGB 11.0*  HCT 38.1*  MCV 99.2  PLT 180    Cardiac Enzymes: No results for input(s): CKTOTAL, CKMB, CKMBINDEX, TROPONINI in the last 168 hours.  BNP (last 3 results) No results for input(s): BNP in the last 8760 hours.  ProBNP (last 3 results) No results for input(s): PROBNP in the last 8760 hours.  Radiological Exams: No results  found.  Assessment/Plan Active Problems:   Acute on chronic respiratory failure with hypoxia (HCC)   Myotonic dystrophy (HCC)   Cardiac arrest (HCC)   Bradycardia   1. Acute on chronic respiratory failure with hypoxia we will continue with full support on the capping trials 2. Myotonic dystrophy at baseline continue with supportive care 3. Bradycardia rhythm has been stable 4. Cardiac arrest rhythm is been stable continue to monitor   I have personally seen and evaluated the patient, evaluated laboratory and imaging results, formulated the assessment and plan and placed orders. The Patient requires high complexity decision making for assessment and support.  Case was discussed on Rounds with the Respiratory Therapy Staff  Yevonne PaxSaadat A Tramon Crescenzo, MD [facility] Pulmonary Critical Care Medicine Sleep Medicine

## 2017-11-26 DIAGNOSIS — R001 Bradycardia, unspecified: Secondary | ICD-10-CM | POA: Diagnosis not present

## 2017-11-26 DIAGNOSIS — I469 Cardiac arrest, cause unspecified: Secondary | ICD-10-CM | POA: Diagnosis not present

## 2017-11-26 DIAGNOSIS — J9621 Acute and chronic respiratory failure with hypoxia: Secondary | ICD-10-CM | POA: Diagnosis not present

## 2017-11-26 DIAGNOSIS — G7111 Myotonic muscular dystrophy: Secondary | ICD-10-CM | POA: Diagnosis not present

## 2017-11-26 LAB — VANCOMYCIN, TROUGH
VANCOMYCIN TR: 28 ug/mL — AB (ref 15–20)
VANCOMYCIN TR: 8 ug/mL — AB (ref 15–20)

## 2017-11-26 NOTE — Progress Notes (Signed)
Pulmonary Critical Care [facility]   PULMONARY CRITICAL [facility]ARE SERVICE  PROGRESS NOTE  Date of Service: 11/26/2017  Seth Nakayamaaron L Voit  ZOX:[id number]  DOB: 1980/04/29   DOA: 11/10/2017  Referring Physician: Carron CurieAli Hijazi, MD  HPI: Seth Melton is a 37 y.o. male seen for follow up of Acute on Chronic Respiratory Failure.  At this time patient is resting comfortably without distress.  Remains capping  Medications: Reviewed on Rounds  Physical Exam:  Vitals: Temperature 98.3 pulse 79 respiratory rate 18 blood pressure 116/45 saturations 100%  Ventilator Settings capping at this time off the ventilator  . General: Comfortable at this time . Eyes: Grossly normal lids, irises & conjunctiva . ENT: grossly tongue is normal . Neck: no obvious mass . Cardiovascular: S1 S2 normal no gallop . Respiratory: No rhonchi or rales are noted . Abdomen: soft . Skin: no rash seen on limited exam . Musculoskeletal: not rigid . Psychiatric:unable to assess . Neurologic: no seizure no involuntary movements         Lab Data:   Basic Metabolic Panel: Recent Labs  Lab 11/24/17 0557  NA 145  K 4.3  CL 102  CO2 34*  GLUCOSE 110*  BUN 16  CREATININE 0.64  CALCIUM 9.9    ABG: No results for input(s): PHART, PCO2ART, PO2ART, HCO3, O2SAT in the last 168 hours.  Liver Function Tests: No results for input(s): AST, ALT, ALKPHOS, BILITOT, PROT, ALBUMIN in the last 168 hours. No results for input(s): LIPASE, AMYLASE in the last 168 hours. No results for input(s): AMMONIA in the last 168 hours.  CBC: Recent Labs  Lab 11/24/17 0557  WBC 9.8  HGB 11.0*  HCT 38.1*  MCV 99.2  PLT 180    Cardiac Enzymes: No results for input(s): CKTOTAL, CKMB, CKMBINDEX, TROPONINI in the last 168 hours.  BNP (last 3 results) No results for input(s): BNP in the last 8760 hours.  ProBNP (last 3 results) No results for input(s): PROBNP in the last 8760 hours.  Radiological  Exams: No results found.  Assessment/Plan Active Problems:   Acute on chronic respiratory failure with hypoxia (HCC)   Myotonic dystrophy (HCC)   Cardiac arrest (HCC)   Bradycardia   1. Acute on chronic respiratory failure with hypoxia patient is done well capping secretions are minimal.  He should be able to proceed to decannulation. 2. Myotonic dystrophy at baseline we will continue supportive care 3. Cardiac arrest no further arrhythmias 4. Bradycardia has been stable we will continue to monitor   I have personally seen and evaluated the patient, evaluated laboratory and imaging results, formulated the assessment and plan and placed orders. The Patient requires high complexity decision making for assessment and support.  Case was discussed on Rounds with the Respiratory Therapy Staff  Yevonne PaxSaadat A Khan, MD [facility] Pulmonary Critical Care Medicine Sleep Medicine

## 2020-04-25 IMAGING — RF DG SWALLOWING FUNCTION - NRPT [facility]
13 of 17 series · 13 of 24 positions shown · non-contrast
Comparison: none

[Series 1: run · 1 of 23 frames shown (1 of 13)]
[frame 2/23]
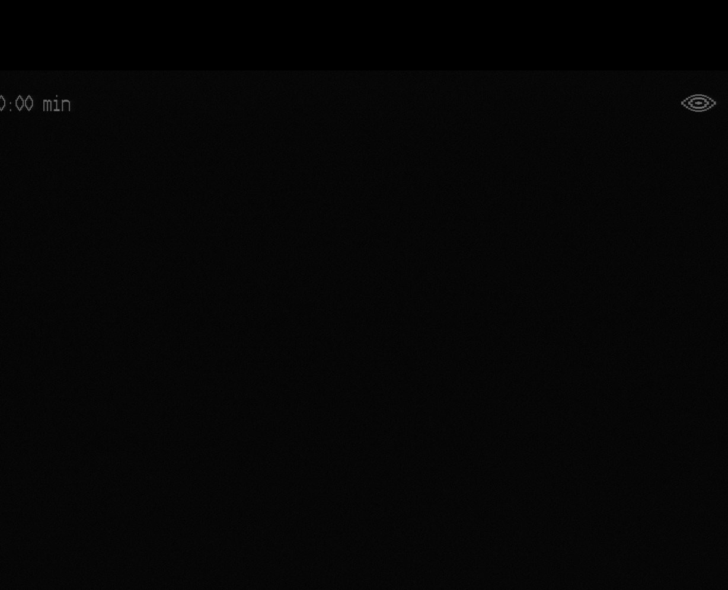

[Series 2: run · 1 of 11 frames shown (2 of 13)]
[frame 10/11]
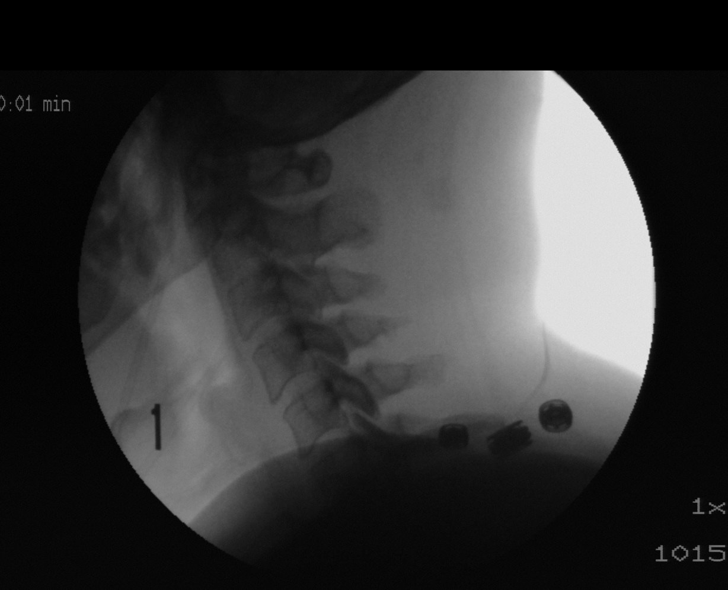

[Series 4: run · 1 of 530 frames shown (3 of 13)]
[frame 55/530]
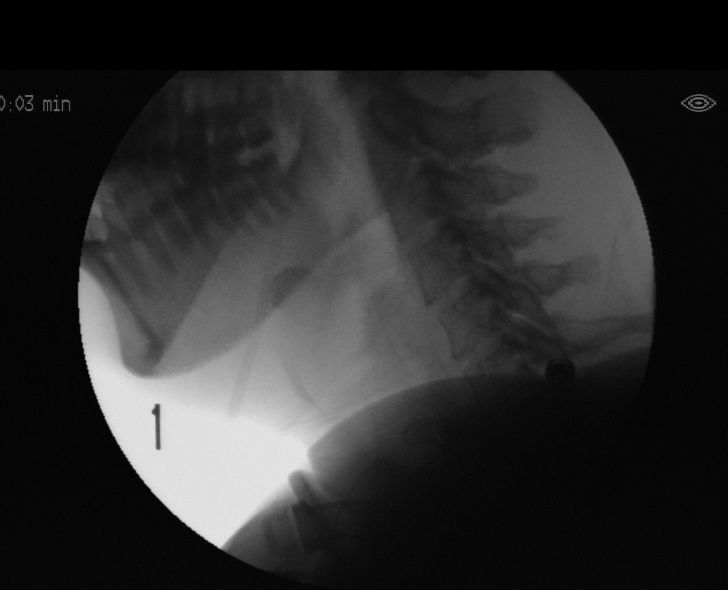

[Series 5: run · 1 of 347 frames shown (4 of 13)]
[frame 174/347]
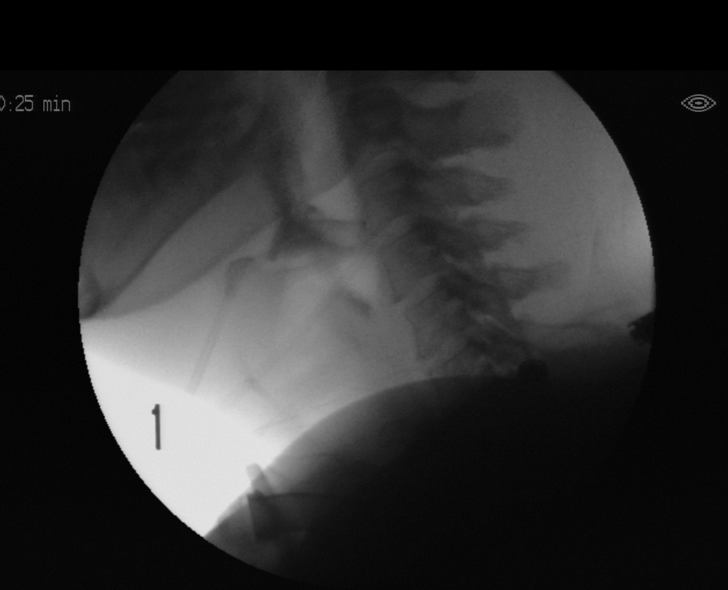

[Series 6: run · 1 of 93 frames shown (5 of 13)]
[frame 80/93]
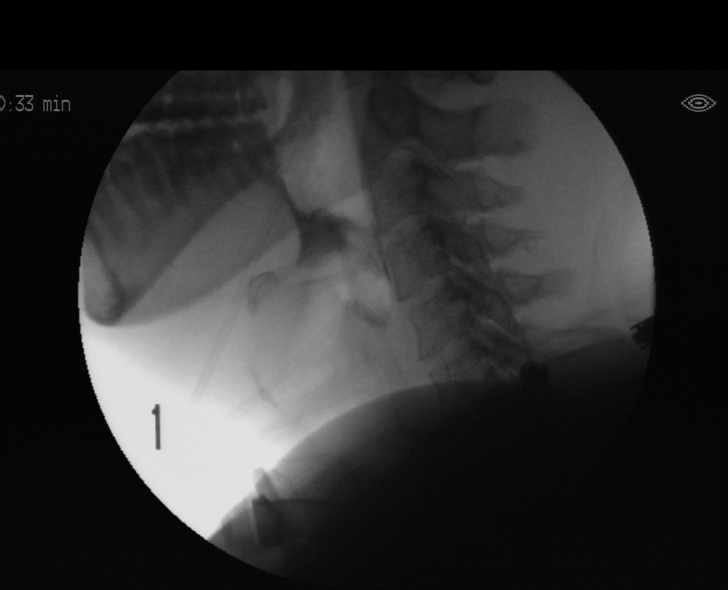

[Series 8: run · 1 of 317 frames shown (6 of 13)]
[frame 57/317]
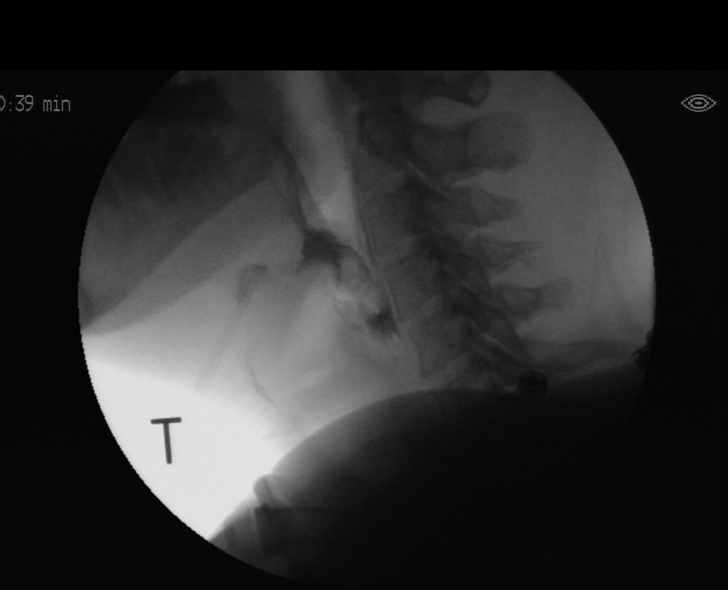

[Series 9: run · 1 of 186 frames shown (7 of 13)]
[frame 159/186]
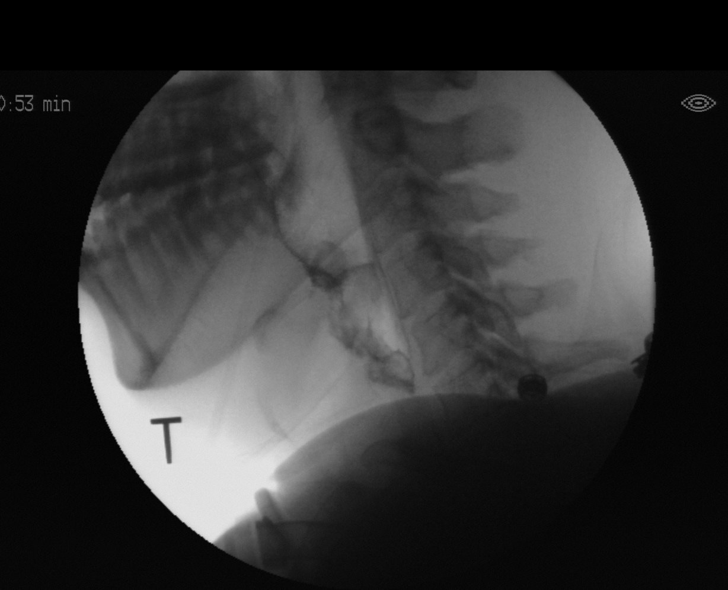

[Series 10: run · 1 of 227 frames shown (8 of 13)]
[frame 114/227]
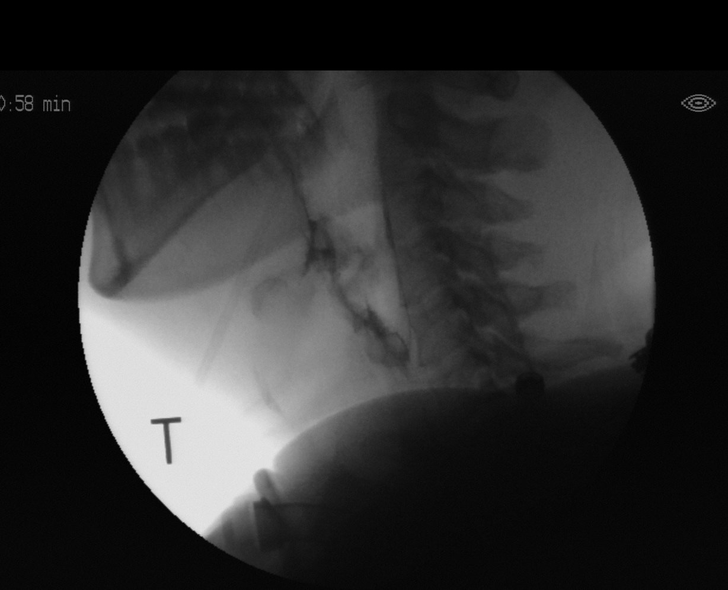

[Series 12: run · 1 of 106 frames shown (9 of 13)]
[frame 16/106]
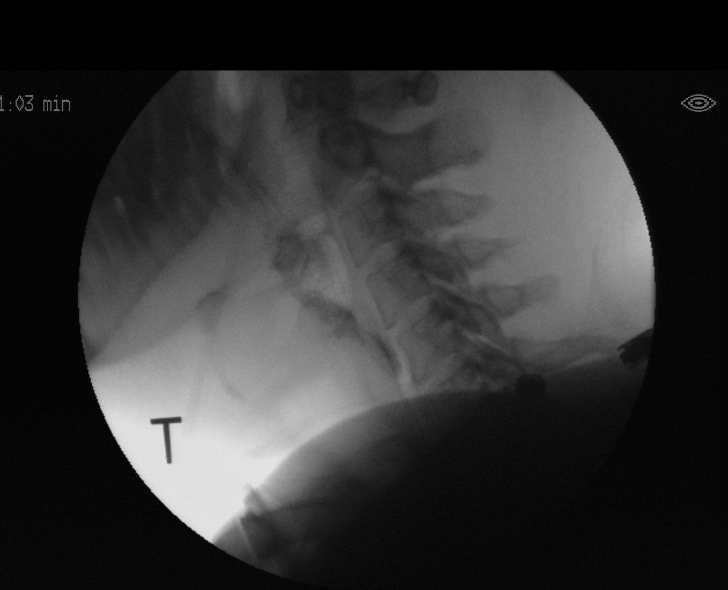

[Series 13: run · 1 of 177 frames shown (10 of 13)]
[frame 89/177]
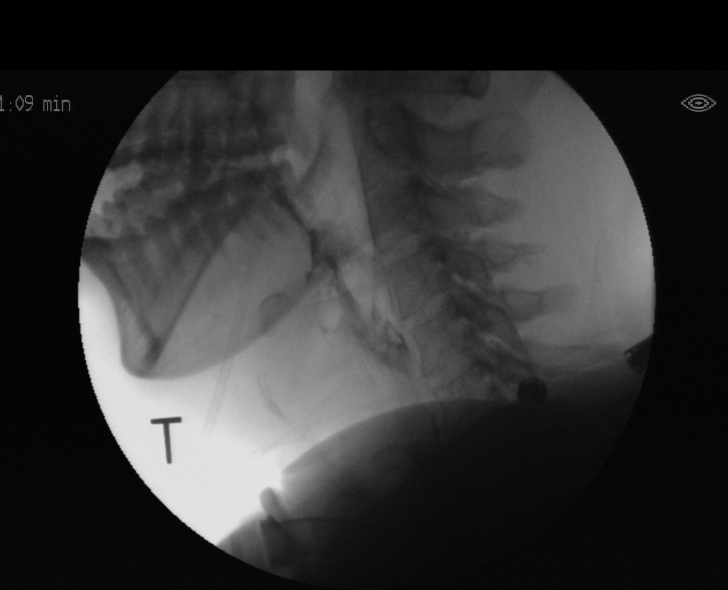

[Series 14: run · 1 of 130 frames shown (11 of 13)]
[frame 111/130]
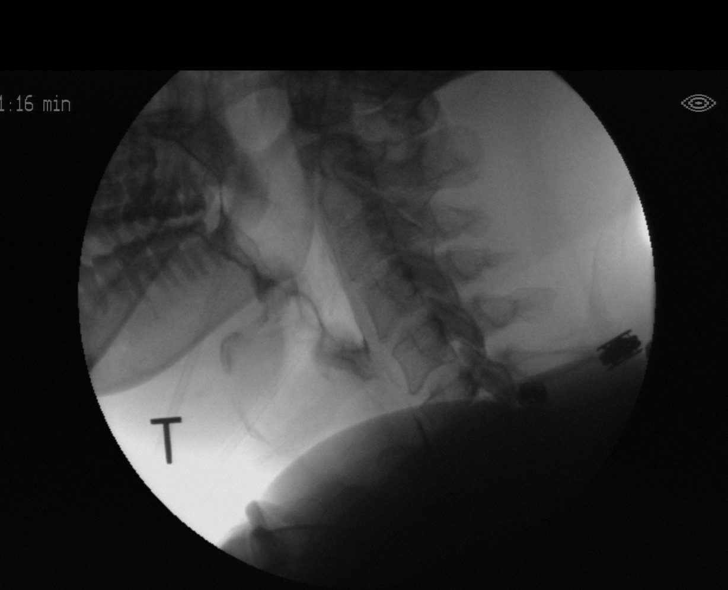

[Series 16: run · 1 of 106 frames shown (12 of 13)]
[frame 47/106]
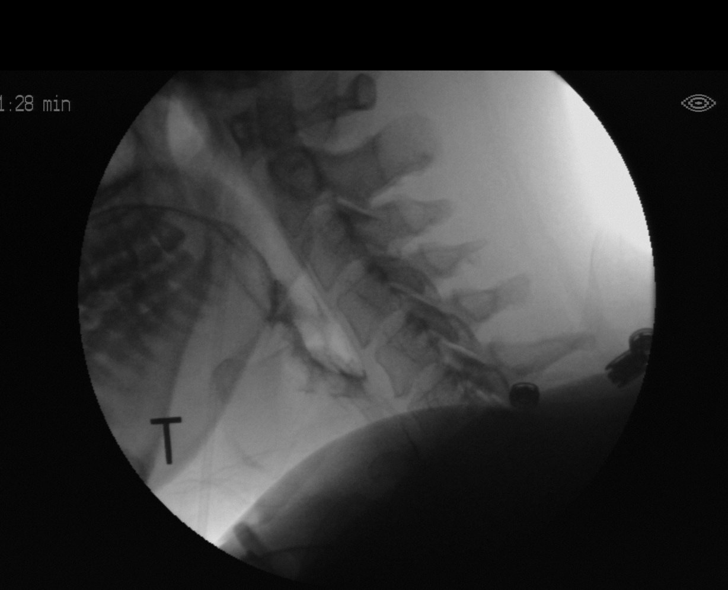

[Series 17: run · 1 of 442 frames shown (13 of 13)]
[frame 376/442]
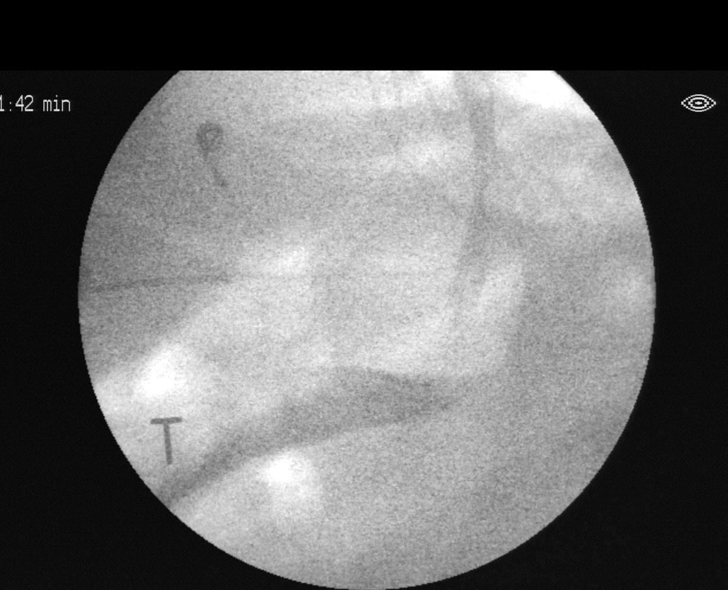

[13 of 24 positions shown; findings below may reference images not displayed]

FLUOROSCOPY FOR SWALLOWING FUNCTION STUDY:
Fluoroscopy was provided for swallowing function study, which was administered by a speech pathologist.  Final results and recommendations from this study are contained within the speech pathology report.
